# Patient Record
Sex: Female | Born: 1970 | Race: Black or African American | Hispanic: No | Marital: Single | Smoking: Never smoker
Health system: Southern US, Community
[De-identification: ages and names within clinical notes are randomized; demographics above are authoritative.]

## PROBLEM LIST (undated history)

## (undated) HISTORY — PX: FOOT SURGERY: SHX648

---

## 1999-01-22 ENCOUNTER — Other Ambulatory Visit: Admission: RE | Admit: 1999-01-22 | Discharge: 1999-01-22 | Payer: Self-pay | Admitting: *Deleted

## 1999-09-19 ENCOUNTER — Encounter: Payer: Self-pay | Admitting: Neurosurgery

## 1999-09-19 ENCOUNTER — Encounter: Payer: Self-pay | Admitting: Emergency Medicine

## 1999-09-19 ENCOUNTER — Ambulatory Visit (HOSPITAL_COMMUNITY): Admission: EM | Admit: 1999-09-19 | Discharge: 1999-09-19 | Payer: Self-pay | Admitting: Emergency Medicine

## 2000-08-31 ENCOUNTER — Other Ambulatory Visit: Admission: RE | Admit: 2000-08-31 | Discharge: 2000-08-31 | Payer: Self-pay | Admitting: *Deleted

## 2004-10-31 ENCOUNTER — Emergency Department (HOSPITAL_COMMUNITY): Admission: EM | Admit: 2004-10-31 | Discharge: 2004-10-31 | Payer: Self-pay | Admitting: Family Medicine

## 2005-03-18 ENCOUNTER — Inpatient Hospital Stay (HOSPITAL_COMMUNITY): Admission: AD | Admit: 2005-03-18 | Discharge: 2005-03-18 | Payer: Self-pay | Admitting: Obstetrics and Gynecology

## 2005-05-09 ENCOUNTER — Encounter (INDEPENDENT_AMBULATORY_CARE_PROVIDER_SITE_OTHER): Payer: Self-pay | Admitting: *Deleted

## 2005-05-09 ENCOUNTER — Ambulatory Visit (HOSPITAL_BASED_OUTPATIENT_CLINIC_OR_DEPARTMENT_OTHER): Admission: RE | Admit: 2005-05-09 | Discharge: 2005-05-09 | Payer: Self-pay | Admitting: Otolaryngology

## 2005-08-21 ENCOUNTER — Inpatient Hospital Stay (HOSPITAL_COMMUNITY): Admission: AD | Admit: 2005-08-21 | Discharge: 2005-08-21 | Payer: Self-pay | Admitting: Obstetrics

## 2006-02-21 ENCOUNTER — Inpatient Hospital Stay (HOSPITAL_COMMUNITY): Admission: AD | Admit: 2006-02-21 | Discharge: 2006-02-21 | Payer: Self-pay | Admitting: Obstetrics and Gynecology

## 2006-02-24 ENCOUNTER — Inpatient Hospital Stay (HOSPITAL_COMMUNITY): Admission: AD | Admit: 2006-02-24 | Discharge: 2006-02-24 | Payer: Self-pay | Admitting: Obstetrics and Gynecology

## 2006-02-26 ENCOUNTER — Inpatient Hospital Stay (HOSPITAL_COMMUNITY): Admission: AD | Admit: 2006-02-26 | Discharge: 2006-03-02 | Payer: Self-pay | Admitting: Urology

## 2006-02-27 ENCOUNTER — Encounter (INDEPENDENT_AMBULATORY_CARE_PROVIDER_SITE_OTHER): Payer: Self-pay | Admitting: Specialist

## 2006-03-03 ENCOUNTER — Encounter: Admission: RE | Admit: 2006-03-03 | Discharge: 2006-03-31 | Payer: Self-pay | Admitting: Obstetrics and Gynecology

## 2006-04-01 ENCOUNTER — Encounter: Admission: RE | Admit: 2006-04-01 | Discharge: 2006-05-01 | Payer: Self-pay | Admitting: Obstetrics and Gynecology

## 2006-05-02 ENCOUNTER — Encounter: Admission: RE | Admit: 2006-05-02 | Discharge: 2006-05-31 | Payer: Self-pay | Admitting: Obstetrics and Gynecology

## 2010-05-21 NOTE — Op Note (Signed)
NAMECHRIS, NARASIMHAN            ACCOUNT NO.:  [id number]   MEDICAL RECORD NO.:  [id number]          PATIENT TYPE:  INP   LOCATION:  9132                          FACILITY:  [facility]   PHYSICIAN:  Maxie Better, M.D.DATE OF BIRTH:  1970/05/04   DATE OF PROCEDURE:  02/27/2006  DATE OF DISCHARGE:                               OPERATIVE REPORT   PREOPERATIVE DIAGNOSIS:  1. Arrest of dilatation.  2. Presumed chorioamnionitis.  3. Post dates.   PROCEDURE:  1. A primary cesarean section.  2. Sharl Ma hysterotomy.   POSTOPERATIVE DIAGNOSES:  1. Arrested dilatation.  2. Presumed chorioamnionitis.  3. Post dates.   ANESTHESIA:  Epidural.   SURGEON:  Maxie Better, M.D.   PROCEDURE:  Under adequate epidural anesthesia, the patient was placed  in the supine position with a left lateral tilt.  She was sterilely  prepped and draped in the usual fashion.  An indwelling Foley catheter  was already in place.  Then 1/4% Marcaine was injected along the planned  Pfannenstiel skin incision site.  A Pfannenstiel skin incision was then  made, carried down to the rectus fascia.  Rectus fascia was opened  transversely.  Rectus fascia was then bluntly and sharply dissected off  the rectus muscle in superior and inferior fashion.  The rectus muscle  was split in midline.  The parietal peritoneum was entered bluntly and  extended.  The vesicouterine peritoneum was opened transversely; and the  bladder was bluntly dissected off the lower uterine segment.  A low  transverse uterine incision was then made and extended bilaterally using  bandage scissors.  Subsequent delivery of a live female was accomplished.  Baby was bulb suctioned on the abdomen.  Cord was clamped, cut; and the  baby was transferred to the awaiting pediatrician who assigned Apgars of  9 and 9 at one and five minutes.  The placenta was spontaneous and  intact.   Uterine cavity was cleaned of debris.  Uterine incision had no  extension.  Uterine incision was closed in 2-layers, first layer of #0  Monocryl running lock stitch.  Second layer was imbricated using #0  Monocryl sutures.  Abdomen was then copiously irrigated, and suctioned  debris.  Normal tubes and ovaries were noted bilaterally.  The  parietoperitoneum was not closed.  The rectus fascia was closed with #0  Vicryl x2.  The subcutaneous area was irrigated, small bleeders  cauterized; 2-0 plain interrupted sutures were then placed in the  subcutaneous area; and the skin approximated using Ethicon staples.   Specimen was placenta sent to pathology.  Estimated blood loss was 500  mL.  Intraoperative fluid was 1500 mL crystalloid.  Urine output was 200  mL clear yellow urine.  Sponge and instrument counts x2 was correct.  Complication was none.  Weight of the baby was 8 pounds 5 ounces.  The  patient tolerated the procedure well and was transferred to recovery  room in stable condition.      Maxie Better, M.D.  Electronically Signed     DISH/MEDQ  D:  02/27/2006  T:  02/27/2006  Job:  [id number]

## 2010-05-21 NOTE — Discharge Summary (Signed)
[name], [name]            ACCOUNT NO.:  [id number]   MEDICAL RECORD NO.:  [id number]          PATIENT TYPE:  INP   LOCATION:  9111                          FACILITY:  [facility]   PHYSICIAN:  Maxie Better, M.D.DATE OF BIRTH:  [birth year]   DATE OF ADMISSION:  02/26/2006  DATE OF DISCHARGE:  03/02/2006                               DISCHARGE SUMMARY   ADMISSION DIAGNOSES:  Post dates, early labor.   DISCHARGE DIAGNOSES:  1. Post dates, delivered.  2. Arrested dilatation.  3. Presumed chorioamnionitis.  4. Postoperative anemia.   PROCEDURE:  Primary cesarean section.   HISTORY OF PRESENT ILLNESS:  A 40 year old G57, P0-0-3-0 female at 40-4/7  weeks who presented in early labor.  Group B strep was negative with  prenatal course that had been uncomplicated.   HOSPITAL COURSE:  The patient was admitted.  She was 1-2, 60% and -2.  She had a reactive tracing, irregular contractions.  Bolus pitocin was  started.  The patient had arrest of dilatation.  During her labor she  had a temperature spike consistent with presumed chorioamnionitis.  Ampicillin and gentamicin were continued.  The patient had spontaneous  rupture of membranes during her labor.  She remained unchanged at 8 cm  in the right occiput posterior presentation.  Decision was then made to  proceed with a primary cesarean section.  At the cesarean section, live  female, 8 pounds 5 ounces, Apgars of 9 and 9.  The patient was continued  on triple antibiotics postoperatively until she was afebrile.  She had  an episode of elevation in blood pressure for which PIH labs were  obtained and  was normal.  By postop day #3 she was afebrile and vital  signs were stable.  Her incision had no erythema, induration or  exudates.  She had been afebrile.  CBC on postop day #1 showed a  hemoglobin of 9.5, white count of 19.8, hematocrit 27.9.  Urine culture  was negative.  The patient was deemed well to be discharged home.   DISPOSITION:  Home.   CONDITION ON DISCHARGE:  Stable.   DISCHARGE MEDICATIONS:  1. Motrin 800 mg every 8 hours p.r.n. pain.  2. Percocet 1-2 tablets every 6 hours p.r.n.  3. Prenatal vitamins 1 p.o. q. day.  4. Niferex Forte 1 p.o. q. day.   DISCHARGE INSTRUCTIONS:  Per the postpartum book given.   FOLLOWUP:  Follow up appointment at [facility] in 6 weeks.      Maxie Better, M.D.  Electronically Signed     [place]/MEDQ  D:  04/30/2006  T:  04/30/2006  Job:  [id number]

## 2010-05-21 NOTE — H&P (Signed)
[name], PICKERAL            ACCOUNT NO.:  [id number]   MEDICAL RECORD NO.:  [id number]          PATIENT TYPE:  AMB   LOCATION:  DSC                          FACILITY:  [facility]   PHYSICIAN:  Hermelinda Medicus, M.D.   DATE OF BIRTH:  1970-02-21   DATE OF ADMISSION:  05/09/2005  DATE OF DISCHARGE:                                HISTORY & PHYSICAL   This patient is a 40 year old female who has had considerable tonsillitis  problems in the past.  She has been on Z-Pak and Augmentin. More recently  she has had tonsillitis episodes with sore throats where she has missed work  for about 1 week. She has had 6-7 episode in the past 12-18 months.  She  also snores quite severely but there has never been a concern where she has  had sleep apnea.  She now enters for a tonsillectomy and also enters for a  turbinate reduction as she has considerable difficulty breathing through her  nose but has a septum that is straight.   PAST MEDICAL HISTORY:  She has also had headaches she felt to be somewhat  related to sinus issues.  She has been on Z-Pak most recently on April 30  she started this. She has no allergies to medications.  She does not smoke  or drink.  She has had 2 foot surgeries 10 and 25 years ago and also had a  right knee surgery.  She apparently had a miscarriage in March 2007 so  therefore, we are going to get a pregnancy test which we will check this to  make sure she is not pregnant as her period has been somewhat irregular.   PHYSICAL EXAMINATION:  GENERAL:  She is a well-nourished female in no acute  distress.  VITAL SIGNS:  Blood pressure of 123/85, weight 193. Her BMI is 32.  Her  heart rate is 85.  HEENT:  Her ears are clear, tympanic membranes are clear and move well.  Her  nose is congested with turbinate hypertrophy.  Her oral cavity shows quite  large tonsils and with exudate. Her neck is free of any thyromegaly,  cervical adenopathy or mass.  No salivary gland  abnormalities. Lip, teeth  and gums are unremarkable.  Her larynx is clear to cords, false cords,  epiglottis, base of tongue are clear __________ mobility, gag reflex normal  but __________ and facial nerve are all symmetrical.  CHEST:  Her chest is clear, no rales, rhonchi or wheezes.  CARDIOVASCULAR:  No open snaps, murmurs or gallops.  ABDOMEN:  Unremarkable.  EXTREMITIES:  Unremarkable except for the previous foot surgery and her  right knee which she apparently has some arthritis.   DIAGNOSIS:  Our initial diagnosis is tonsillitis with tonsillar hypertrophy  with turbinate hypertrophy with snoring and history of foot surgery, history  of right knee arthritis.           ______________________________  Hermelinda Medicus, M.D.     JC/MEDQ  D:  05/09/2005  T:  05/09/2005  Job:  [id number]

## 2011-06-19 ENCOUNTER — Emergency Department
Admission: EM | Admit: 2011-06-19 | Discharge: 2011-06-19 | Disposition: A | Discharge status: Home / Self Care | Payer: BC Managed Care – PPO | Source: Home / Self Care | Attending: Emergency Medicine | Admitting: Emergency Medicine

## 2011-06-19 DIAGNOSIS — A499 Bacterial infection, unspecified: Secondary | ICD-10-CM

## 2011-06-19 DIAGNOSIS — N760 Acute vaginitis: Secondary | ICD-10-CM

## 2011-06-19 LAB — POCT WET + KOH PREP

## 2011-06-19 LAB — POCT URINALYSIS DIP (MANUAL ENTRY)
Protein Ur, POC: NEGATIVE
pH, UA: 7

## 2011-06-19 MED ORDER — METRONIDAZOLE 500 MG PO TABS: 500.0000 mg | ORAL_TABLET | Freq: Two times a day (BID) | ORAL | Status: AC

## 2011-06-19 MED ORDER — FLUCONAZOLE 150 MG PO TABS: 150.0000 mg | ORAL_TABLET | Freq: Once | ORAL | Status: AC

## 2011-06-19 NOTE — ED Notes (Signed)
Pt found out her bf was cheating 2 wks ago.  She has had bv symptoms, vaginal discomfot and discharge and was treated for BV 2 wks ago.  Symptoms have come back.  She wants std tests also

## 2011-06-19 NOTE — ED Provider Notes (Signed)
sHistory     CSN: [id number]  Arrival date & time 06/19/11  1452   First MD Initiated Contact with Patient 06/19/11 1547      Chief Complaint  Patient presents with  . Vaginitis    (Consider location/radiation/quality/duration/timing/severity/associated sxs/prior treatment) HPI This patient has had some vaginal discomfort as last week.  She was recently treated with MetroGel for bacterial vaginitis which did help completely.  When she finished that the symptoms return.  She's been having some mild vaginal irritation and sent in a warm tub last night which helped a little bit.  She does state that her partner for the last 6 years was unfaithful to her but she is unsure of his status with STDs.  She would like to be tested today as well.  No fever chills.  No dysuria or hematuria or frequency.  She describes the vaginal discomfort as a soreness.  History reviewed. No pertinent past medical history.  Past Surgical History  Procedure Date  . Foot surgery     Family History  Problem Relation Age of Onset  . Hypertension Mother   . Cancer Father     History  Substance Use Topics  . Smoking status: Never Smoker   . Smokeless tobacco: Not on file  . Alcohol Use: No    OB History    Grav Para Term Preterm Abortions TAB SAB Ect Mult Living                  Review of Systems  All other systems reviewed and are negative.    Allergies  Review of patient's allergies indicates no known allergies.  Home Medications   Current Outpatient Rx  Name Route Sig Dispense Refill  . FLUCONAZOLE 150 MG PO TABS Oral Take 1 tablet (150 mg total) by mouth once. May repeat in 3 days 2 tablet 0  . METRONIDAZOLE 500 MG PO TABS Oral Take 1 tablet (500 mg total) by mouth 2 (two) times daily. 14 tablet 0    BP 146/88  Pulse 94  Temp 98.2 F (36.8 C) (Oral)  Resp 14  Ht 5\' 5"  (1.651 m)  Wt 199 lb 8 oz (90.493 kg)  BMI 33.20 kg/m2  SpO2 99%  LMP 05/18/2011  Physical Exam  Nursing  note and vitals reviewed. Constitutional: She is oriented to person, place, and time. She appears well-developed and well-nourished.  HENT:  Head: Normocephalic and atraumatic.  Eyes: No scleral icterus.  Neck: Neck supple.  Cardiovascular: Regular rhythm and normal heart sounds.   Pulmonary/Chest: Effort normal and breath sounds normal. No respiratory distress.  Genitourinary: There is no rash or lesion on the right labia. There is no rash or lesion on the left labia. No erythema or tenderness around the vagina. No signs of injury around the vagina. Vaginal discharge (Mild white) found.  Neurological: She is alert and oriented to person, place, and time.  Skin: Skin is warm and dry.  Psychiatric: She has a normal mood and affect. Her speech is normal.    ED Course  Procedures (including critical care time)   Labs Reviewed  POCT WET + KOH PREP (DR PERFORMED @ [facility])  POCT URINALYSIS DIP (MANUAL ENTRY)  URINE CULTURE  GC/CHLAMYDIA PROBE AMP, GENITAL   No results found.   1. Vaginosis       MDM   Wet prep she does have some clue cells so we will treat her with metronidazole for a week.  KOH showed no yeast.  No trichomoniasis were seen.  Because of the irritation, also give her Diflucan as well.  We will send out a swab for GC and Chlamydia as well as send a urine culture.  No sex in the meantime.  If not improving, will need followup with her gynecologist or her PCP.     Marlaine Hind, MD 06/19/11 [phone]

## 2011-06-21 LAB — URINE CULTURE: Colony Count: 60000

## 2011-06-21 LAB — GC/CHLAMYDIA PROBE AMP, GENITAL
Chlamydia, DNA Probe: NEGATIVE
GC Probe Amp, Genital: NEGATIVE

## 2011-06-22 ENCOUNTER — Telehealth: Payer: Self-pay | Admitting: Emergency Medicine

## 2017-02-13 ENCOUNTER — Encounter: Payer: Self-pay | Admitting: Emergency Medicine

## 2017-02-13 ENCOUNTER — Other Ambulatory Visit: Payer: Self-pay

## 2017-02-13 ENCOUNTER — Emergency Department (INDEPENDENT_AMBULATORY_CARE_PROVIDER_SITE_OTHER)
Admission: EM | Admit: 2017-02-13 | Discharge: 2017-02-13 | Disposition: A | Discharge status: Home / Self Care | Payer: Self-pay | Source: Home / Self Care | Attending: Family Medicine | Admitting: Family Medicine

## 2017-02-13 ENCOUNTER — Emergency Department (INDEPENDENT_AMBULATORY_CARE_PROVIDER_SITE_OTHER): Payer: PRIVATE HEALTH INSURANCE

## 2017-02-13 DIAGNOSIS — M533 Sacrococcygeal disorders, not elsewhere classified: Secondary | ICD-10-CM

## 2017-02-13 DIAGNOSIS — S73101A Unspecified sprain of right hip, initial encounter: Secondary | ICD-10-CM

## 2017-02-13 DIAGNOSIS — M25551 Pain in right hip: Secondary | ICD-10-CM

## 2017-02-13 MED ORDER — PREDNISONE 20 MG PO TABS: ORAL_TABLET | ORAL | 0 refills | Status: AC

## 2017-02-13 MED ORDER — CYCLOBENZAPRINE HCL 10 MG PO TABS: 10.0000 mg | ORAL_TABLET | Freq: Every day | ORAL | 1 refills | Status: AC

## 2017-02-13 NOTE — Discharge Instructions (Signed)
Apply ice pack for 20 to 30 minutes, 3 to 4 times daily  Continue until pain and swelling decrease.  May take Tylenol for pain, if needed.  Begin range of motion and stretching exercises in about 5 days.
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
## 2020-06-22 ENCOUNTER — Other Ambulatory Visit: Payer: Self-pay | Admitting: Nurse Practitioner

## 2020-06-22 ENCOUNTER — Other Ambulatory Visit: Payer: Self-pay

## 2020-06-22 ENCOUNTER — Ambulatory Visit (INDEPENDENT_AMBULATORY_CARE_PROVIDER_SITE_OTHER): Payer: Worker's Compensation

## 2020-06-22 DIAGNOSIS — M25522 Pain in left elbow: Secondary | ICD-10-CM

## 2020-06-22 DIAGNOSIS — R52 Pain, unspecified: Secondary | ICD-10-CM

## 2021-06-29 ENCOUNTER — Other Ambulatory Visit: Payer: Self-pay | Admitting: Physician Assistant

## 2021-06-29 ENCOUNTER — Ambulatory Visit (INDEPENDENT_AMBULATORY_CARE_PROVIDER_SITE_OTHER): Payer: Self-pay

## 2021-06-29 DIAGNOSIS — S4992XA Unspecified injury of left shoulder and upper arm, initial encounter: Secondary | ICD-10-CM

## 2021-06-29 DIAGNOSIS — R52 Pain, unspecified: Secondary | ICD-10-CM

## 2021-06-29 DIAGNOSIS — M542 Cervicalgia: Secondary | ICD-10-CM

## 2021-06-29 DIAGNOSIS — W19XXXA Unspecified fall, initial encounter: Secondary | ICD-10-CM
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
